# Patient Record
Sex: Male | Born: 2000 | Race: White | Hispanic: Yes | Marital: Single | Smoking: Never smoker
Health system: Southern US, Community
[De-identification: ages and names within clinical notes are randomized; demographics above are authoritative.]

## PROBLEM LIST (undated history)

## (undated) DIAGNOSIS — J302 Other seasonal allergic rhinitis: Secondary | ICD-10-CM

## (undated) DIAGNOSIS — S61219A Laceration without foreign body of unspecified finger without damage to nail, initial encounter: Secondary | ICD-10-CM

---
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
## 2018-08-13 ENCOUNTER — Other Ambulatory Visit: Payer: Self-pay

## 2018-08-13 ENCOUNTER — Ambulatory Visit
Admission: EM | Admit: 2018-08-13 | Discharge: 2018-08-13 | Disposition: A | Discharge status: Home / Self Care | Payer: No Typology Code available for payment source | Attending: Physician Assistant | Admitting: Physician Assistant

## 2018-08-13 DIAGNOSIS — L739 Follicular disorder, unspecified: Secondary | ICD-10-CM

## 2018-08-13 MED ORDER — CEPHALEXIN 500 MG PO CAPS: 500.0000 mg | ORAL_CAPSULE | Freq: Four times a day (QID) | ORAL | 0 refills | Status: AC

## 2018-08-13 NOTE — Discharge Instructions (Signed)
At this time, warm compress 15-20 mins at a time, 1-3 times a day. If in 2-3 days, has more swelling, redness/warmth, or pain, can fill keflex as directed. Keep area clean and dry. Avoid shaving for now. Follow up for reevaluation if symptoms not improving.

## 2018-08-13 NOTE — ED Triage Notes (Signed)
Pt c/o abscess to pubic area for over a month. Now draining blood

## 2018-08-13 NOTE — ED Provider Notes (Signed)
[facility]    CSN: [id number] Arrival date & time: 08/13/18  1041      History   Chief Complaint Chief Complaint  Patient presents with  . Abscess    HPI Charles Vaughn is a 18 y.o. male.   18 year old male comes in for few day history of possible abscess to the groin area.  States noticed worsening swelling, pain 2 days ago, yesterday, tried to self drain the area without relief. Noticed more swelling this morning and came in for evaluation. Denies spreading erythema, warmth, fever. Does shave area periodically.      Past Medical History:  Diagnosis Date  . Finger laceration involving tendon 10/26/2016   extensor tendon right middle finger  . Seasonal allergies     There are no active problems to display for this patient.   Past Surgical History:  Procedure Laterality Date  . REPAIR EXTENSOR TENDON Right 11/03/2016   Procedure: EXPLORATION/REPAIR EXTENSOR TENDON RIGHT MIDDLE FINGER;  Surgeon: Leanora Cover, MD;  Location: [place];  Service: Orthopedics;  Laterality: Right;       Home Medications    Prior to Admission medications   Medication Sig Start Date End Date Taking? Authorizing Provider  cephALEXin (KEFLEX) 500 MG capsule Take 1 capsule (500 mg total) by mouth 4 (four) times daily. 08/13/18   Ok Edwards, PA-C    Family History No family history on file.  Social History Social History   Tobacco Use  . Smoking status: Never Smoker  . Smokeless tobacco: Never Used  Substance Use Topics  . Alcohol use: No  . Drug use: No     Allergies   Patient has no known allergies.   Review of Systems Review of Systems  Reason unable to perform ROS: See HPI as above.     Physical Exam Triage Vital Signs ED Triage Vitals [08/13/18 1054]  Enc Vitals Group     BP 118/79     Pulse Rate (!) 53     Resp 16     Temp 98 F (36.7 C)     Temp Source Oral     SpO2 98 %     Weight      Height      Head Circumference    Peak Flow      Pain Score 5     Pain Loc      Pain Edu?      Excl. in [place]?    No data found.  Updated Vital Signs BP 118/79 (BP Location: Left Arm)   Pulse (!) 53   Temp 98 F (36.7 C) (Oral)   Resp 16   SpO2 98%   Physical Exam Constitutional:      General: He is not in acute distress.    Appearance: He is well-developed. He is not diaphoretic.  HENT:     Head: Normocephalic and atraumatic.  Eyes:     Conjunctiva/sclera: Conjunctivae normal.     Pupils: Pupils are equal, round, and reactive to light.  Genitourinary:      Comments: 1cm x 1cm swelling with central wound to the pubic area. No erythema, warmth. No tenderness to palpation Neurological:     Mental Status: He is alert and oriented to person, place, and time.      UC Treatments / Results  Labs (all labs ordered are listed, but only abnormal results are displayed) Labs Reviewed - No data to display  EKG   Radiology No results  found.  Procedures Procedures (including critical care time)  Medications Ordered in UC Medications - No data to display  Initial Impression / Assessment and Plan / UC Course  I have reviewed the triage vital signs and the nursing notes.  Pertinent labs & imaging results that were available during my care of the patient were reviewed by me and considered in my medical decision making (see chart for details).    No signs of cellulitis/abscess at this time. Will have patient do warm compress at this time. Written Rx of keflex provided, discussed when to start taking. Wound care instructions given. Return precautions given. Patient expresses understanding and agrees to plan.  Final Clinical Impressions(s) / UC Diagnoses   Final diagnoses:  Folliculitis    ED Prescriptions    Medication Sig Dispense Auth. Provider   cephALEXin (KEFLEX) 500 MG capsule Take 1 capsule (500 mg total) by mouth 4 (four) times daily. 28 capsule Threasa AlphaYu, Jashaun Penrose V, PA-C        Heyward Douthit V, [place]PA-C 08/13/18  1228

## 2018-11-06 IMAGING — CR DG HAND COMPLETE 3+V*R*
3 series · 3 of 3 positions shown · non-contrast
Comparison: None in PACs

CLINICAL DATA: Direct trauma to the right hand when a missing bold
fell on it at work. There is a laceration over the extensor surface
of the third MCP joint.

EXAM:
RIGHT HAND - COMPLETE 3+ VIEW

[x hand pa right]
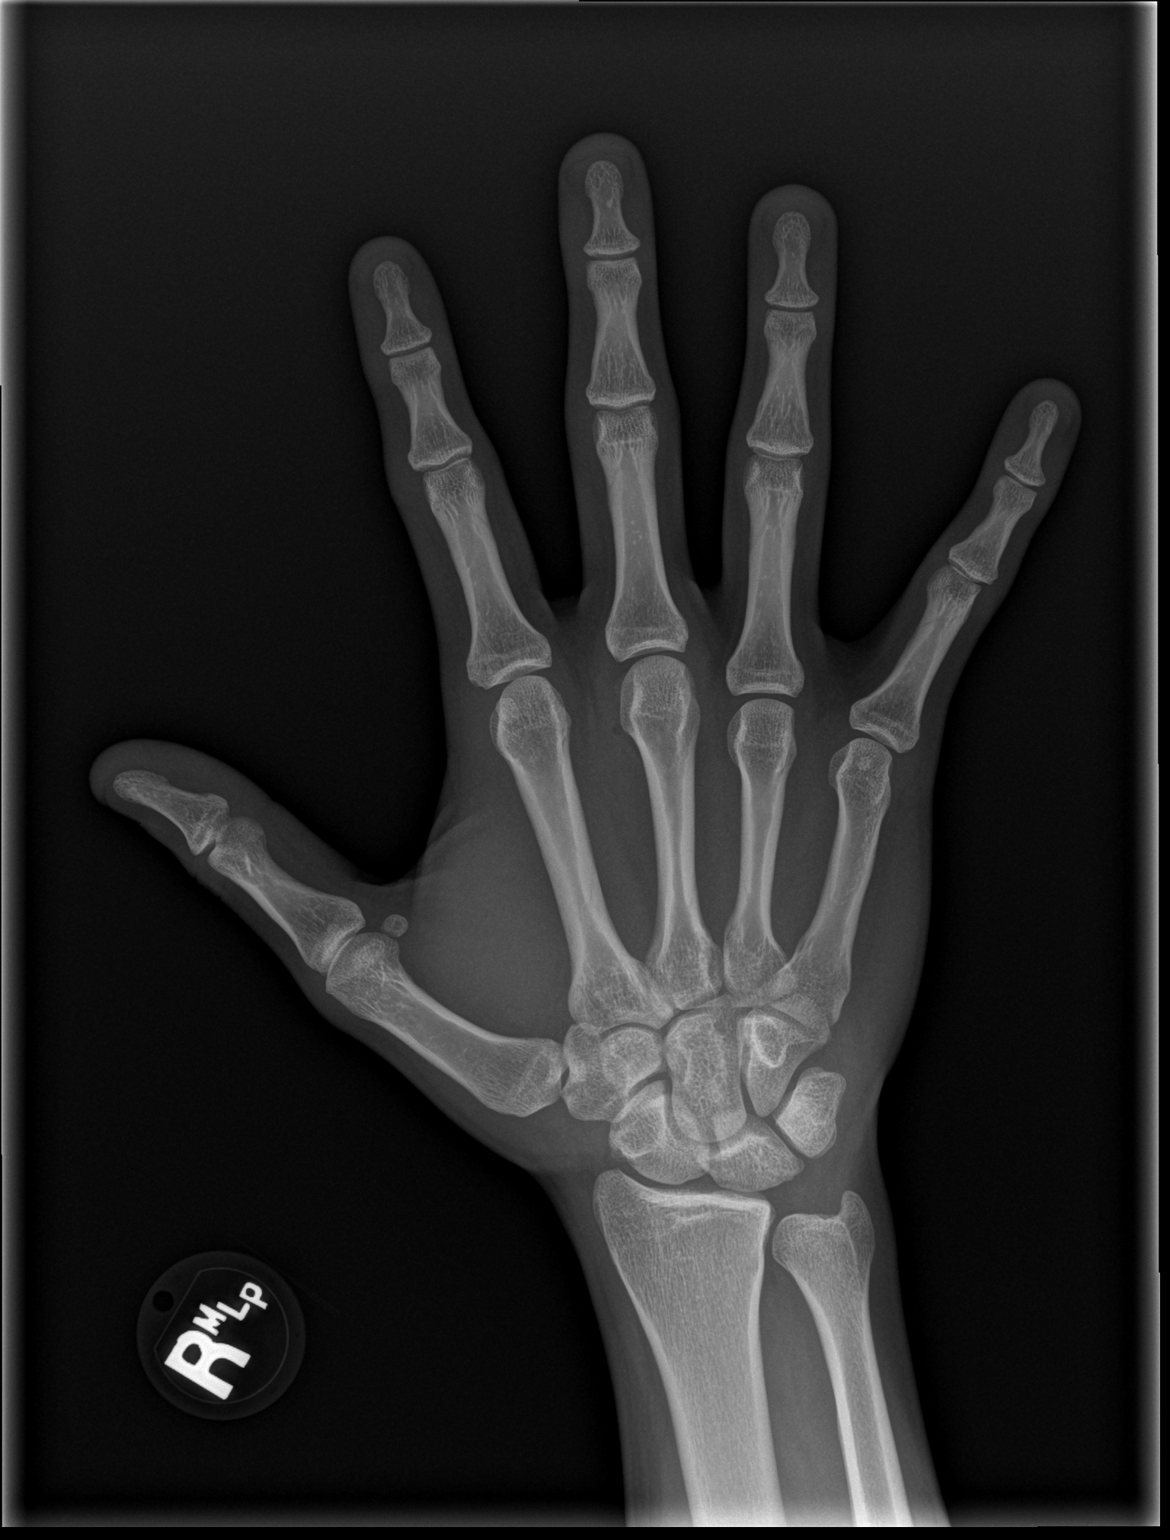

[x hand obl right]
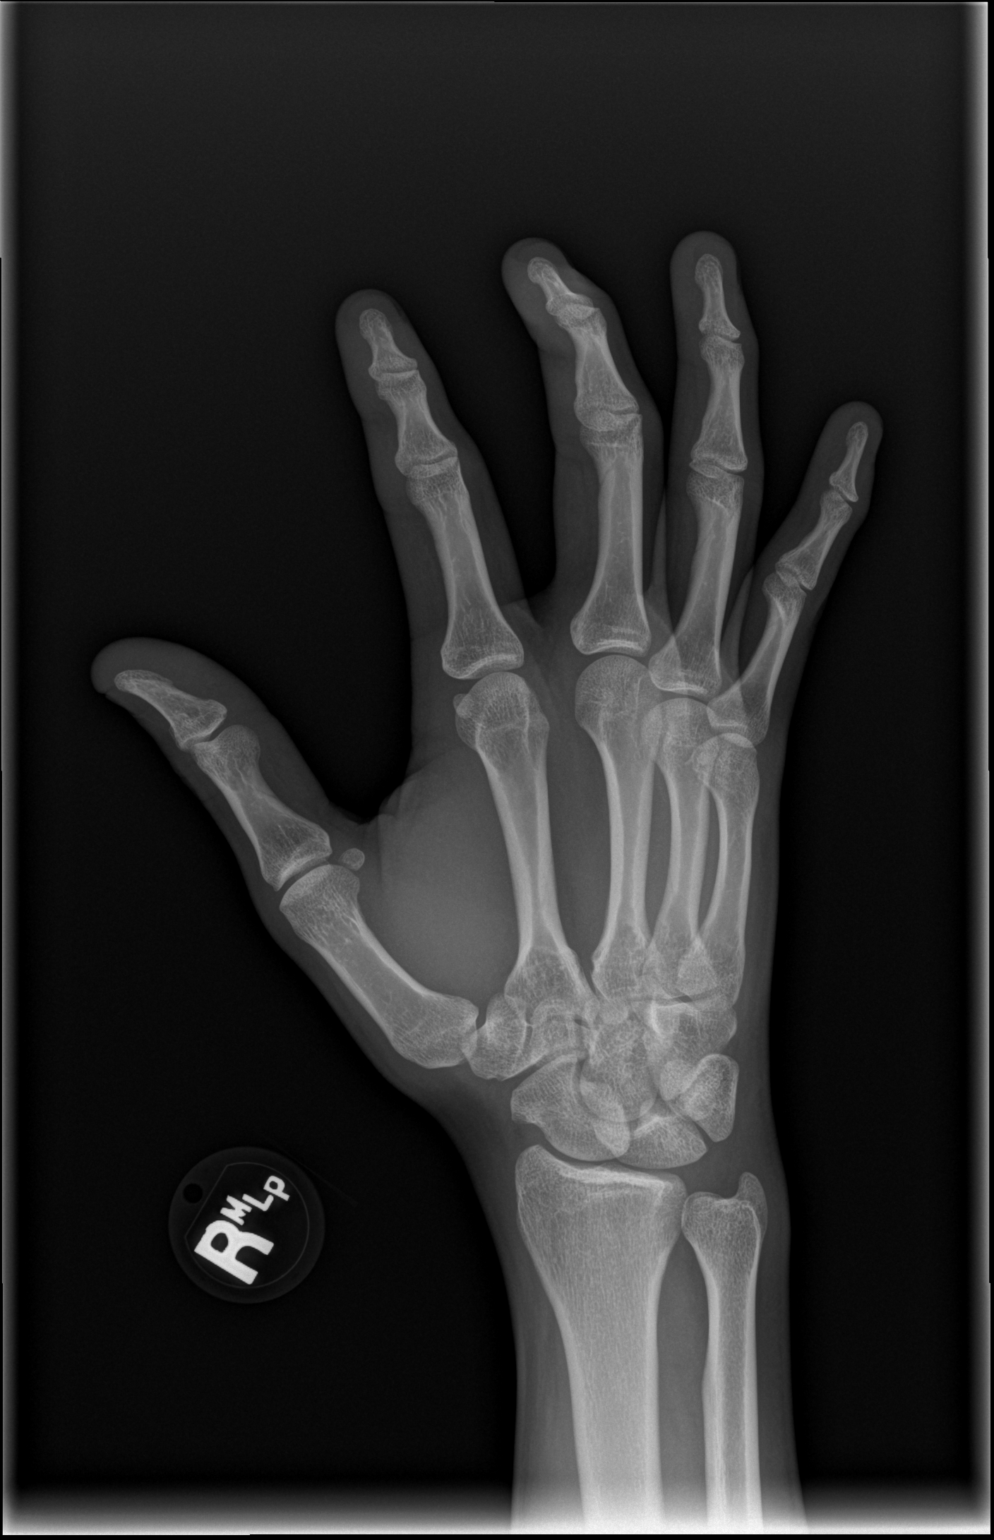

[x hand lat right]
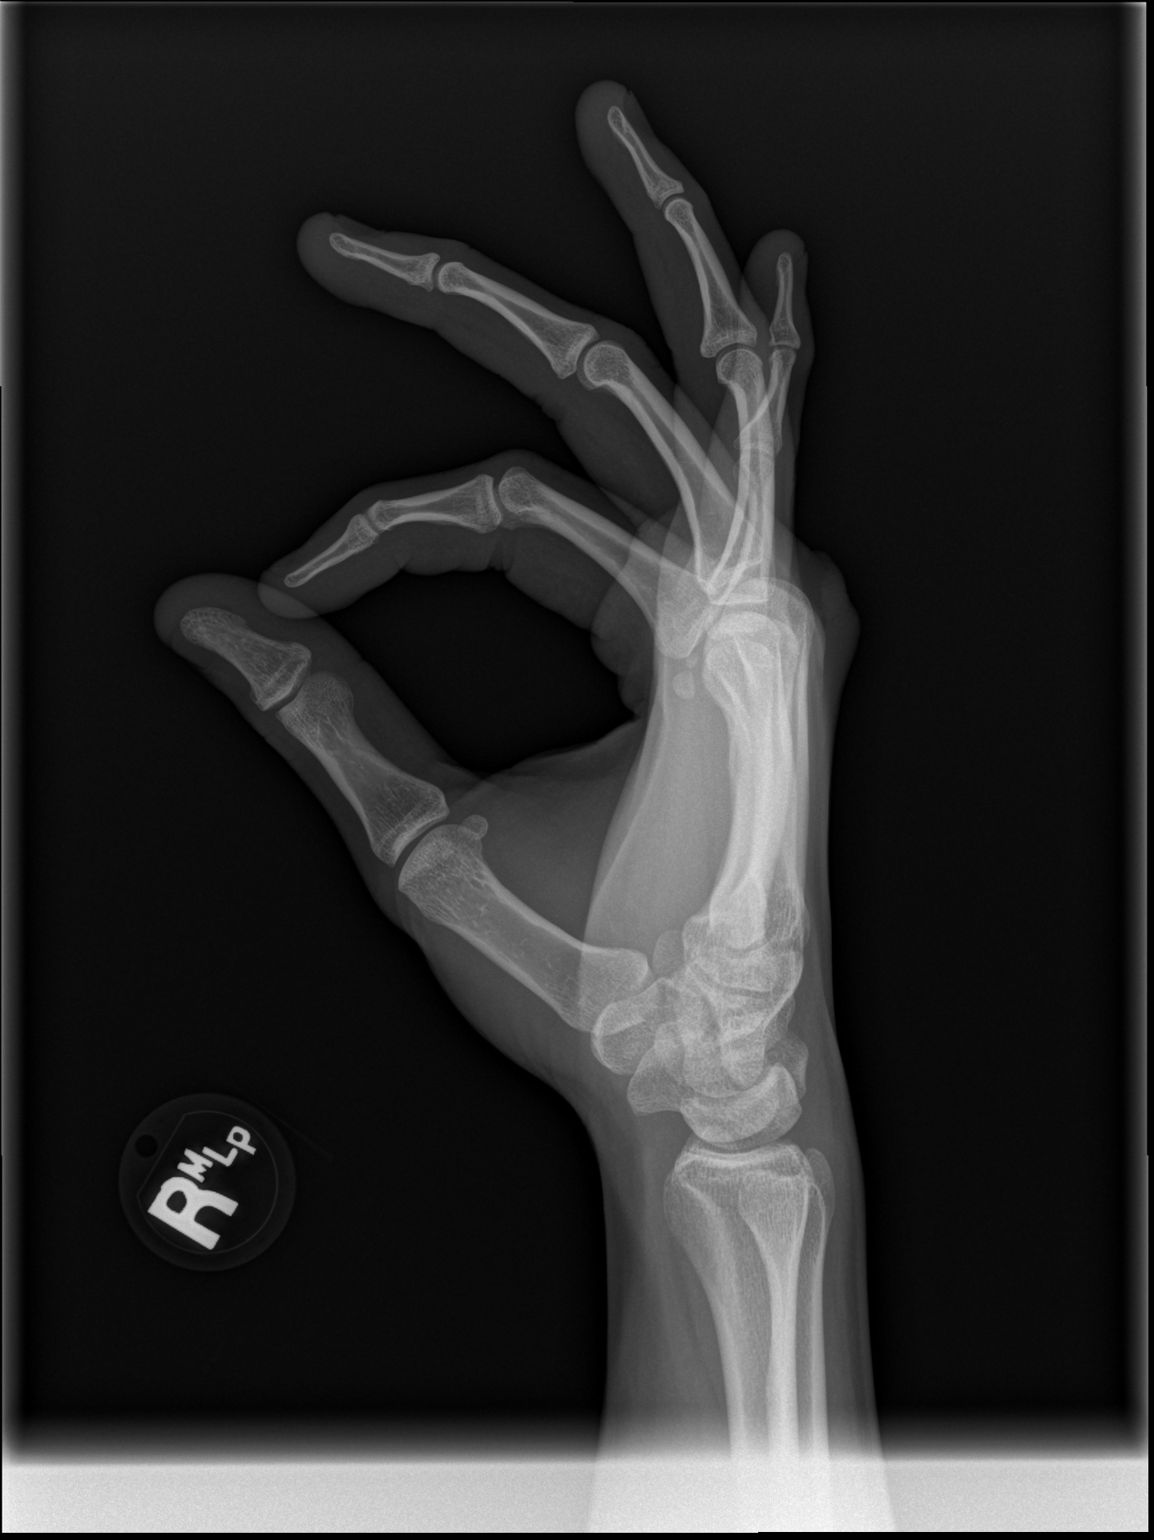

[3 of 3 positions shown; findings below may reference images not displayed]

FINDINGS: The bones are subjectively adequately mineralized. There is a small
amount of soft tissue gas over the head of the third metacarpal.
There is no acute fracture nor dislocation. The joint spaces are
well maintained. No radiopaque foreign bodies are observed.
IMPRESSION: There is soft tissue injury over the third MCP joint region. There
is no acute fracture nor dislocation.
